# Patient Record
Sex: Female | Born: 1955 | Race: Black or African American | Hispanic: No | Marital: Married | Smoking: Former smoker
Health system: Southern US, Community
[De-identification: ages and names within clinical notes are randomized; demographics above are authoritative.]

## PROBLEM LIST (undated history)

## (undated) DIAGNOSIS — E785 Hyperlipidemia, unspecified: Secondary | ICD-10-CM

## (undated) DIAGNOSIS — G4730 Sleep apnea, unspecified: Secondary | ICD-10-CM

## (undated) DIAGNOSIS — F32A Depression, unspecified: Secondary | ICD-10-CM

## (undated) DIAGNOSIS — K589 Irritable bowel syndrome without diarrhea: Secondary | ICD-10-CM

## (undated) DIAGNOSIS — K219 Gastro-esophageal reflux disease without esophagitis: Secondary | ICD-10-CM

## (undated) DIAGNOSIS — O009 Unspecified ectopic pregnancy without intrauterine pregnancy: Secondary | ICD-10-CM

## (undated) DIAGNOSIS — E119 Type 2 diabetes mellitus without complications: Secondary | ICD-10-CM

## (undated) DIAGNOSIS — F4310 Post-traumatic stress disorder, unspecified: Secondary | ICD-10-CM

## (undated) DIAGNOSIS — I10 Essential (primary) hypertension: Secondary | ICD-10-CM

## (undated) DIAGNOSIS — R42 Dizziness and giddiness: Secondary | ICD-10-CM

## (undated) DIAGNOSIS — M797 Fibromyalgia: Secondary | ICD-10-CM

## (undated) HISTORY — PX: ECTOPIC PREGNANCY SURGERY: SHX613

## (undated) HISTORY — PX: HERNIA REPAIR: SHX51

## (undated) HISTORY — PX: HYSTEROSCOPY: SHX211

## (undated) HISTORY — PX: COLONOSCOPY: SHX174

## (undated) HISTORY — PX: BREAST BIOPSY: SHX20

---

## 2013-05-19 ENCOUNTER — Emergency Department: Payer: Enrolled Prime—HMO

## 2013-05-19 ENCOUNTER — Emergency Department
Admission: EM | Admit: 2013-05-19 | Discharge: 2013-05-19 | Disposition: A | Discharge status: Home / Self Care | Payer: Enrolled Prime—HMO | Attending: Emergency Medicine | Admitting: Emergency Medicine

## 2013-05-19 DIAGNOSIS — J101 Influenza due to other identified influenza virus with other respiratory manifestations: Secondary | ICD-10-CM

## 2013-05-19 DIAGNOSIS — J111 Influenza due to unidentified influenza virus with other respiratory manifestations: Secondary | ICD-10-CM | POA: Insufficient documentation

## 2013-05-19 DIAGNOSIS — Z87891 Personal history of nicotine dependence: Secondary | ICD-10-CM | POA: Insufficient documentation

## 2013-05-19 MED ORDER — IPRATROPIUM BROMIDE 0.02 % IN SOLN
0.5000 mg | Freq: Once | RESPIRATORY_TRACT | Status: AC
Start: 2013-05-19 — End: 2013-05-19
  Administered 2013-05-19: 0.5 mg via RESPIRATORY_TRACT
  Filled 2013-05-19: qty 2.5

## 2013-05-19 MED ORDER — GUAIFENESIN-CODEINE 100-10 MG/5ML PO SYRP
5.0000 mL | ORAL_SOLUTION | Freq: Three times a day (TID) | ORAL | Status: AC | PRN
Start: 2013-05-19 — End: ?

## 2013-05-19 MED ORDER — ALBUTEROL SULFATE (2.5 MG/3ML) 0.083% IN NEBU
2.5000 mg | INHALATION_SOLUTION | Freq: Once | RESPIRATORY_TRACT | Status: AC
Start: 2013-05-19 — End: 2013-05-19
  Administered 2013-05-19: 2.5 mg via RESPIRATORY_TRACT
  Filled 2013-05-19: qty 3

## 2013-05-19 MED ORDER — OSELTAMIVIR PHOSPHATE 75 MG PO CAPS
75.0000 mg | ORAL_CAPSULE | Freq: Two times a day (BID) | ORAL | Status: AC
Start: 2013-05-19 — End: 2013-05-24

## 2013-05-19 MED ORDER — OSELTAMIVIR PHOSPHATE 75 MG PO CAPS
75.0000 mg | ORAL_CAPSULE | Freq: Once | ORAL | Status: AC
Start: 2013-05-19 — End: 2013-05-19
  Administered 2013-05-19: 75 mg via ORAL
  Filled 2013-05-19: qty 1

## 2013-05-19 NOTE — ED Provider Notes (Signed)
ATTENDING :Melvern Sample, DO.  Review of MLP chart- I have reviewed and agree with the clinical impression, history, physical exam and plan.          Melvern Sample, DO  05/19/13 2233

## 2013-05-19 NOTE — Discharge Instructions (Signed)
Influenza, Seasonal     You have been seen for influenza. This is sometimes called “the flu.” Often, no tests are done but influenza is strongly suspected. For this reason, many doctors are giving the diagnosis of “Influenza-Like Illness.”     Influenza is very contagious. This means it spreads easily among people. It is caused by the influenza virus. That is why it is also called "the flu." It affects the respiratory tract. This includes the nose, throat and lungs. In some people, it can cause very serious problems. This usually happens with the very young and the elderly. The Centers for Disease Control and Prevention (CDC) reports that an average of 5-20% of people living in the [place] are affected by regular seasonal influenza every year. This includes influenza A and B. More than 200,000 people are hospitalized from flu complications. About 36,000 people die from flu-related causes every year.     STAY AWAY FROM OTHER PEOPLE WHO ARE NOT YET AFFECTED. THIS IS THE BEST WAY TO STOP THE SPREAD OF INFLUENZA. YOU CAN SPREAD THE FLU FROM ONE DAY BEFORE YOU GET SICK UP TO 7 DAYS AFTER YOU DEVELOP SYMPTOMS.     UNTIL IT IS CONFIRMED THAT YOU DON’T HAVE INFLUENZA, YOU SHOULD:  · Stay off from work. You should be off of work for 7 days from the start of symptoms or until the viral cultures show you don’t have influenza.  · Stay home and do not go into public places.  · Stay away from others in the house.  · Wash your hands often.  · Cover your mouth when you sneeze or cough. Use your sleeve to cover your mouth and nose. Do not use your hand.     People you live with and those you had close contact with, should watch for flu signs. People with a serious medical condition or who are pregnant should tell their doctor they were exposed to flu.     Influenza often happens as an epidemic in the fall and winter. This means many people get the flu virus at the same time. It spreads from person to person. Over the centuries, there  have been epidemics of many different “new” strains of influenza.     Most people with influenza get these symptoms:  · High fevers: Fevers are often higher than 102°F or 39°C.  · Cough. A dry, hacking cough is most common.  · Headache: The headache may be worse with the fever. It can be there even when the temperature is normal.  · Muscle aches. Many people have muscle pain. These are called “myalgias.”  · Sore throat.     You may also feel weak and fatigued (tired). You may also get a runny nose or congestion (stuffy nose). Some people will have mild stomach symptoms. This can include vomiting (throwing up). Sometimes it includes mild diarrhea.     Influenza does not primarily involve vomiting and diarrhea. Vomiting and diarrhea are usually symptoms of a completely different problem. This problem is called "gastroenteritis." Gastroenteritis is often called "the stomach flu." This is how the confusion came about! True influenza is a lung infection.     During flu season, influenza can sometimes be diagnosed without tests. This can be done if a patient has the right set of symptoms. These are called "classic flu" symptoms. Sometimes flu testing is done. This is usually with a test called a “Rapid Influenza Test.” Results show up in a few minutes. The test is not perfect. It misses many cases. Sometimes, more specialized tests   are done. These tests pick up more of the cases. However, results are not ready for a few days.     Usually, the "flu shot" is good at preventing influenza. Some years the shot is less effective than others. You can sometimes still get severe influenza, even if you had the immunization (shot). You can still get "the stomach flu," (not true influenza), even after the flu shot. Today, the current Influenza vaccine is designed to protect against seasonal Influenza A and B and H1N1. There are good supplies of the vaccine available. It is being given to all persons who would like to receive  it.     Influenza treatment depends on how early in the course of the illness you are when diagnosed. With medicine, you may feel better sooner. Medicines do not cure the flu. However, they do help your body fight the infection.     Some antiviral medicines may help symptoms. They often only help if they are started in the first few days of the illness. The Centers for Disease Control and Prevention (CDC) makes recommendations as to who should get the antiviral medicine. Your doctor will decide if the medicine may be helpful for you.     Most patients get better with simple things like rest, fluids and a little time. Acetaminophen (Tylenol®) and/or ibuprofen (Advil® or Motrin®) can also help. Older patients or those with serious medical problems may have worse problems with the flu. They may need additional care.     Treatment of influenza usually is at home. Some patients need to be in the hospital. These are often very young and very old patients and those with severe symptoms. It also includes those with other serious medical problems.     It is OK for you to go home now. Follow the instructions above about staying away from others!     YOU SHOULD SEEK MEDICAL ATTENTION IMMEDIATELY, EITHER HERE OR AT THE NEAREST EMERGENCY DEPARTMENT, IF ANY OF THE FOLLOWING OCCURS:  · You have shortness of breath, wheezing or any severe problems breathing.   · You have a headache that gets worse or a stiff neck.  · You feel lightheaded or faint.  · You have chest pain.  · You have a cough and fever (temperature higher than 100.4ºF or 38ºC) that seem to come back AFTER your flu symptoms get better. This can be a different kind of lung infection called “pneumonia,” that sometimes happens after the flu.  · You feel sicker at any time or do not get better as expected. You can expect to feel sick for a week or so.  · You have any other new symptoms or concerns.     If you can t follow up with your doctor, or if at any time you feel  you need to be rechecked or seen again, come back here or go to the nearest emergency department.

## 2013-05-19 NOTE — ED Notes (Signed)
Cough x 3 days with low grade fever at night; productive yellow sputum

## 2013-05-19 NOTE — ED Provider Notes (Signed)
Physician/Midlevel provider first contact with patient: 05/19/13 1826         History     Chief Complaint   Patient presents with   . Cough     Patient is a 58 y.o. female presenting with cough. The history is provided by the patient.   Cough  This is a new problem. Episode onset: 3 days. The problem occurs constantly. The problem has not changed since onset.The cough is productive of sputum. There has been no fever. Associated symptoms include chills and rhinorrhea. Pertinent negatives include no chest pain, no sweats, no weight loss, no headaches, no sore throat, no myalgias, no shortness of breath and no wheezing. She is not a smoker.       History reviewed. No pertinent past medical history.    Past Surgical History   Procedure Date   . Hernia repair    . Ectopic pregnancy surgery        No family history on file.    Social  History   Substance Use Topics   . Smoking status: Former Games developer   . Smokeless tobacco: Not on file   . Alcohol Use: No       .     No Known Allergies    Current/Home Medications    NITROFURANTOIN (MACRODANTIN) 100 MG CAPSULE    Take 100 mg by mouth.        Review of Systems   Constitutional: Positive for chills. Negative for fever and weight loss.   HENT: Positive for congestion and rhinorrhea. Negative for dental problem, sore throat and trouble swallowing.    Respiratory: Positive for cough. Negative for chest tightness, shortness of breath and wheezing.    Cardiovascular: Negative for chest pain, palpitations and leg swelling.   Gastrointestinal: Negative for nausea, vomiting and abdominal pain.   Genitourinary: Negative for dysuria, frequency and flank pain.   Musculoskeletal: Negative for back pain, myalgias and neck pain.   Skin: Negative for rash.   Neurological: Negative for dizziness, weakness and headaches.   Psychiatric/Behavioral: Negative for confusion, self-injury and decreased concentration.       Physical Exam    BP: 151/85 mmHg, Heart Rate: 95 , Temp: 97.8 F (36.6 C),  Resp Rate: 16 , SpO2: 97 %, Weight: 81.647 kg    Physical Exam   Nursing note and vitals reviewed.  Constitutional: She is oriented to person, place, and time. She appears well-developed and well-nourished.   HENT:   Head: Normocephalic and atraumatic.   Right Ear: External ear normal.   Left Ear: External ear normal.   Mouth/Throat: Oropharynx is clear and moist.   Eyes: Conjunctivae normal and EOM are normal. Pupils are equal, round, and reactive to light. No scleral icterus.   Neck: Normal range of motion. Neck supple.        No meningeal signs   Cardiovascular: Normal rate, regular rhythm, normal heart sounds and intact distal pulses.    Pulses:       Dorsalis pedis pulses are 2+ on the right side, and 2+ on the left side.   Pulmonary/Chest: Effort normal and breath sounds normal. No respiratory distress. She has no wheezes. She exhibits no tenderness.   Abdominal: Soft. Bowel sounds are normal. She exhibits no distension. There is no tenderness. There is no rebound and no guarding.        No pulsatile mass   Musculoskeletal: Normal range of motion. She exhibits no edema and no tenderness.   Neurological: She  is alert and oriented to person, place, and time.   Skin: Skin is warm and dry. No rash noted. No pallor.   Psychiatric: She has a normal mood and affect. Her behavior is normal. Thought content normal.       MDM and ED Course     ED Medication Orders      Start     Status Ordering Provider    05/19/13 1917   oseltamivir (TAMIFLU) capsule 75 mg   Once      Route: Oral  Ordered Dose: 75 mg         Last MAR action:  Given Aleks Nawrot    05/19/13 1851   albuterol (PROVENTIL) nebulizer solution 2.5 mg   RT - Once      Route: Nebulization  Ordered Dose: 2.5 mg         Last MAR action:  Given Rossie Bretado    05/19/13 1851   ipratropium (ATROVENT) 0.02 % nebulizer solution 0.5 mg   RT - Once      Route: Nebulization  Ordered Dose: 0.5 mg         Last MAR action:  Given Tildon Silveria               Radiology Results  (24 Hour)     Procedure Component Value Units Date/Time    Chest 2 Views [[id number]] Collected:05/19/13 1908    Order Status:Completed  Updated:05/19/13 1913    Narrative:    History:  Cough and fever, evaluate for pneumonia.    COMPARISON: None     PA and lateral chest:  The heart size and contour are normal.  Lungs are  clear with normal pulmonary vascularity.  No pleural effusion, hilar or  mediastinal prominence is evident. No pneumothorax is seen. Bony  structures are intact.      Impression:     Normal study.        Geanie Cooley, MD   05/19/2013 7:09 PM        Results     Procedure Component Value Units Date/Time    Influenza A/B Virus Antigen [[id number]] Collected:05/19/13 1843    Specimen Information:Nasopharyngeal / Nasal Aspirate Updated:05/19/13 1900    Narrative:    ORDER#: [id number]                                    ORDERED BY: Carolina Sink  SOURCE: Nasal Aspirate                               COLLECTED:  05/19/13 18:43  ANTIBIOTICS AT COLL.:                                RECEIVED :  05/19/13 18:47  Influenza Rapid Antigen A&B                FINAL       05/19/13 19:01   +  05/19/13   Positive for Influenza A and Negative for Influenza B             Reference Range: Negative            MDM  Number of Diagnoses or Management Options  Influenza A:   Diagnosis management comments: Pt here for cough/congestion for 3 days. Other ppl  at work with simiilar. No fevers. No cp, sob. On exam, pt well appearing. Noted to have coughing. nml lung sounds and nl vitals. No hypoxia and no c/o sob. Pt well appearing. Influenza positive. No resp distress and no hx of lung problems. Stable for outpt management.  Case also discussed with dr Girard Cooter.     I, Carolina Sink- Physician Assistant, have been the primary provider for this pt during their ED stay.     The attending signature signifies review and agreement of the history, physical examination, evaluation, clinical impression and plan except as noted.     02 sat   97%ra, which is nml.           Procedures    Clinical Impression & Disposition     Clinical Impression  Final diagnoses:   Influenza A        ED Disposition     Discharge Gavin Pound discharge to home/self care.    Condition at disposition: Stable             New Prescriptions    GUAIFENESIN-CODEINE (ROBITUSSIN AC) 100-10 MG/5ML SYRUP    Take 5 mLs by mouth 3 (three) times daily as needed for Cough.    OSELTAMIVIR (TAMIFLU) 75 MG CAPSULE    Take 1 capsule (75 mg total) by mouth 2 (two) times daily.                 Mariel Aloe [place], [place]  05/19/13 1958

## 2017-06-20 ENCOUNTER — Emergency Department
Admission: EM | Admit: 2017-06-20 | Discharge: 2017-06-20 | Disposition: A | Discharge status: Home / Self Care | Attending: Emergency Medicine | Admitting: Emergency Medicine

## 2017-06-20 ENCOUNTER — Other Ambulatory Visit: Payer: Self-pay

## 2017-06-20 DIAGNOSIS — I10 Essential (primary) hypertension: Secondary | ICD-10-CM | POA: Insufficient documentation

## 2017-06-20 DIAGNOSIS — R55 Syncope and collapse: Secondary | ICD-10-CM | POA: Diagnosis present

## 2017-06-20 DIAGNOSIS — R42 Dizziness and giddiness: Secondary | ICD-10-CM | POA: Insufficient documentation

## 2017-06-20 HISTORY — DX: Essential (primary) hypertension: I10

## 2017-06-20 HISTORY — DX: Unspecified ectopic pregnancy without intrauterine pregnancy: O00.90

## 2017-06-20 LAB — BASIC METABOLIC PANEL
ANION GAP: 11 (ref 5–15)
BUN: 11 mg/dL (ref 6–20)
CO2: 23 mmol/L (ref 22–32)
Calcium: 9 mg/dL (ref 8.9–10.3)
Chloride: 102 mmol/L (ref 101–111)
Creatinine, Ser: 0.84 mg/dL (ref 0.44–1.00)
GLUCOSE: 129 mg/dL — AB (ref 65–99)
POTASSIUM: 4 mmol/L (ref 3.5–5.1)
SODIUM: 136 mmol/L (ref 135–145)

## 2017-06-20 LAB — URINALYSIS, COMPLETE (UACMP) WITH MICROSCOPIC
BACTERIA UA: NONE SEEN
BILIRUBIN URINE: NEGATIVE
GLUCOSE, UA: NEGATIVE mg/dL
HGB URINE DIPSTICK: NEGATIVE
Ketones, ur: NEGATIVE mg/dL
LEUKOCYTES UA: NEGATIVE
Nitrite: NEGATIVE
PROTEIN: NEGATIVE mg/dL
Specific Gravity, Urine: 1.012 (ref 1.005–1.030)
pH: 7 (ref 5.0–8.0)

## 2017-06-20 LAB — CBC
HEMATOCRIT: 42.9 % (ref 35.0–47.0)
HEMOGLOBIN: 14.2 g/dL (ref 12.0–16.0)
MCH: 28.8 pg (ref 26.0–34.0)
MCHC: 33.2 g/dL (ref 32.0–36.0)
MCV: 86.8 fL (ref 80.0–100.0)
Platelets: 297 10*3/uL (ref 150–440)
RBC: 4.94 MIL/uL (ref 3.80–5.20)
RDW: 13.3 % (ref 11.5–14.5)
WBC: 9.4 10*3/uL (ref 3.6–11.0)

## 2017-06-20 LAB — TROPONIN I

## 2017-06-20 MED ORDER — MECLIZINE HCL 25 MG PO TABS: 25.0000 mg | ORAL_TABLET | Freq: Three times a day (TID) | ORAL | 0 refills | Status: DC | PRN

## 2017-06-20 MED ORDER — ONDANSETRON 4 MG PO TBDP
4.0000 mg | ORAL_TABLET | Freq: Once | ORAL | Status: AC
  Administered 2017-06-20: 4 mg via ORAL

## 2017-06-20 MED ORDER — MECLIZINE HCL 25 MG PO TABS
25.0000 mg | ORAL_TABLET | Freq: Once | ORAL | Status: AC
  Administered 2017-06-20: 25 mg via ORAL
  Filled 2017-06-20: qty 1

## 2017-06-20 MED ORDER — ONDANSETRON 4 MG PO TBDP
ORAL_TABLET | ORAL | Status: AC
  Filled 2017-06-20: qty 1

## 2017-06-20 MED ORDER — SODIUM CHLORIDE 0.9 % IV BOLUS (SEPSIS)
1000.0000 mL | Freq: Once | INTRAVENOUS | Status: AC
  Administered 2017-06-20: 1000 mL via INTRAVENOUS

## 2017-06-20 NOTE — ED Notes (Signed)
Pt currently reporting increased nausea. MD made aware.

## 2017-06-20 NOTE — Discharge Instructions (Signed)
Please seek medical attention for any high fevers, chest pain, shortness of breath, change in behavior, persistent vomiting, bloody stool or any other new or concerning symptoms.  

## 2017-06-20 NOTE — ED Triage Notes (Signed)
Pt states this AM started to feel lightheaded. States went to sleep and felt better but lightheadness has returned. States nausea and fatigue. Denies pain. Pt is alert, oriented, talking in complete sentences. States felt like this Friday but felt fine over weekend. States has been eating/drinking like normal.

## 2017-06-20 NOTE — ED Notes (Signed)
Informed RN that patient has been roomed and is ready for evaluation.  Patient in NAD at this time and call bell placed within reach.   

## 2017-06-20 NOTE — ED Provider Notes (Signed)
[facility]   ____________________________________________   I have reviewed the triage vital signs and the nursing notes.   HISTORY  Chief Complaint Near Syncope   History limited by: Not Limited   HPI Melissa JohnsGlenda D Montez Parks is a 62 y.o. female who presents to the emergency department today because of concern for dizziness and feeling like she was going to pass out. The patient dates that her symptoms started 5 days ago.  It has been intermittent since then.  She does feel lightheaded and dizzy.  She feels like things are moving.  It does get worse when she moves her head.  The patient denies any chest pain or palpitations.  She has had some slight headache.  Has had some nausea but no vomiting.  Denies similar symptoms in the past.    Per medical record review patient has a history of HTN.  Past Medical History:  Diagnosis Date  . Ectopic pregnancy   . Hypertension     There are no active problems to display for this patient.   Past Surgical History:  Procedure Laterality Date  . CESAREAN SECTION    . HERNIA REPAIR      Prior to Admission medications   Not on File    Allergies Premarin [conjugated estrogens]  History reviewed. No pertinent family history.  Social History Social History   Tobacco Use  . Smoking status: Never Smoker  Substance Use Topics  . Alcohol use: Yes  . Drug use: Not on file    Review of Systems Constitutional: No fever/chills Eyes: No visual changes. ENT: No sore throat. Cardiovascular: Denies chest pain. Respiratory: Denies shortness of breath. Gastrointestinal: No abdominal pain.  No nausea, no vomiting.  No diarrhea.   Genitourinary: Negative for dysuria. Musculoskeletal: Negative for back pain. Skin: Negative for rash. Neurological: Positive for headache. Positive for dizziness.  ____________________________________________   PHYSICAL EXAM:  VITAL SIGNS: ED  Triage Vitals  Enc Vitals Group     BP 06/20/17 1826 (!) 171/76     Pulse Rate 06/20/17 1823 65     Resp 06/20/17 1823 18     Temp 06/20/17 1823 97.6 F (36.4 C)     Temp Source 06/20/17 1823 Oral     SpO2 06/20/17 1823 99 %     Weight 06/20/17 1825 190 lb (86.2 kg)     Height 06/20/17 1825 5\' 4"  (1.626 m)     Head Circumference --      Peak Flow --      Pain Score 06/20/17 1823 0   Constitutional: Alert and oriented. Well appearing and in no distress. Eyes: Conjunctivae are normal.  ENT   Head: Normocephalic and atraumatic.   Nose: No congestion/rhinnorhea.   Mouth/Throat: Mucous membranes are moist.   Neck: No stridor. Hematological/Lymphatic/Immunilogical: No cervical lymphadenopathy. Cardiovascular: Normal rate, regular rhythm.  No murmurs, rubs, or gallops.  Respiratory: Normal respiratory effort without tachypnea nor retractions. Breath sounds are clear and equal bilaterally. No wheezes/rales/rhonchi. Gastrointestinal: Soft and non tender. No rebound. No guarding.  Genitourinary: Deferred Musculoskeletal: Normal range of motion in all extremities. No lower extremity edema. Neurologic:  Normal speech and language. PERRL.  EOMI. Tongue midline. Face symmetric. No upper or lower extremity drift. Sensation intact. Finger to nose normal. No gross focal neurologic deficits are appreciated.  Skin:  Skin is warm, dry and intact. No rash noted. Psychiatric: Mood and affect are normal. Speech and behavior are normal. Patient exhibits appropriate insight and  judgment.  ____________________________________________    LABS (pertinent positives/negatives)  Trop <0.03 CBC wnl BMP na 136, glu 129, k 4.0 UA wnl  ____________________________________________   EKG  I, Phineas Semen, attending physician, personally viewed and interpreted this EKG  EKG Time: 1822 Rate: 65 Rhythm: normal sinus rhythm Axis: left axis deviation Intervals: qtc 426 QRS: narrow, lvh ST  changes: no st elevation Impression: abnormal ekg   ____________________________________________    RADIOLOGY  None  ____________________________________________   PROCEDURES  Procedures  ____________________________________________   INITIAL IMPRESSION / ASSESSMENT AND PLAN / ED COURSE  Pertinent labs & imaging results that were available during my care of the patient were reviewed by me and considered in my medical decision making (see chart for details).  Patient presents because of lightheadedness and dizziness.  The patient symptoms have been somewhat on and off over the past couple of days.  Patient's physical exam is benign.  She did feel better with meclizine.  At this time, I have low suspicion for cerebral stroke given intermittent nature and improvement with medications and benign physical exam.  Will plan on discharging with meclizine.  Will have patient follow-up with ENT back home. Discussed plan with patient.   ____________________________________________   FINAL CLINICAL IMPRESSION(S) / ED DIAGNOSES  Final diagnoses:  Vertigo     Note: This dictation was prepared with Dragon dictation. Any transcriptional errors that result from this process are unintentional     Phineas Semen, MD 06/20/17 2325

## 2018-04-04 ENCOUNTER — Emergency Department

## 2018-04-04 ENCOUNTER — Other Ambulatory Visit: Payer: Self-pay

## 2018-04-04 ENCOUNTER — Emergency Department
Admission: EM | Admit: 2018-04-04 | Discharge: 2018-04-04 | Disposition: A | Discharge status: Home / Self Care | Attending: Emergency Medicine | Admitting: Emergency Medicine

## 2018-04-04 DIAGNOSIS — R0789 Other chest pain: Secondary | ICD-10-CM | POA: Diagnosis not present

## 2018-04-04 DIAGNOSIS — R739 Hyperglycemia, unspecified: Secondary | ICD-10-CM | POA: Diagnosis present

## 2018-04-04 DIAGNOSIS — E119 Type 2 diabetes mellitus without complications: Secondary | ICD-10-CM | POA: Insufficient documentation

## 2018-04-04 DIAGNOSIS — I10 Essential (primary) hypertension: Secondary | ICD-10-CM | POA: Diagnosis not present

## 2018-04-04 LAB — BASIC METABOLIC PANEL
Anion gap: 9 (ref 5–15)
BUN: 17 mg/dL (ref 6–20)
CALCIUM: 9.1 mg/dL (ref 8.9–10.3)
CHLORIDE: 98 mmol/L (ref 98–111)
CO2: 24 mmol/L (ref 22–32)
CREATININE: 1.02 mg/dL — AB (ref 0.44–1.00)
GFR calc non Af Amer: 60 mL/min — ABNORMAL LOW (ref >60)
GLUCOSE: 566 mg/dL — AB (ref 70–99)
Potassium: 3.8 mmol/L (ref 3.5–5.1)
SODIUM: 131 mmol/L — AB (ref 135–145)

## 2018-04-04 LAB — URINALYSIS, COMPLETE (UACMP) WITH MICROSCOPIC
Bacteria, UA: NONE SEEN
Bilirubin Urine: NEGATIVE
KETONES UR: 5 mg/dL — AB
Leukocytes, UA: NEGATIVE
Nitrite: NEGATIVE
Protein, ur: NEGATIVE mg/dL
Specific Gravity, Urine: 1.033 — ABNORMAL HIGH (ref 1.005–1.030)
pH: 5 (ref 5.0–8.0)

## 2018-04-04 LAB — CBC
HEMATOCRIT: 45 % (ref 36.0–46.0)
Hemoglobin: 15.3 g/dL — ABNORMAL HIGH (ref 12.0–15.0)
MCH: 28.8 pg (ref 26.0–34.0)
MCHC: 34 g/dL (ref 30.0–36.0)
MCV: 84.6 fL (ref 80.0–100.0)
PLATELETS: 256 10*3/uL (ref 150–400)
RBC: 5.32 MIL/uL — AB (ref 3.87–5.11)
RDW: 12.4 % (ref 11.5–15.5)
WBC: 7.4 10*3/uL (ref 4.0–10.5)
nRBC: 0 % (ref 0.0–0.2)

## 2018-04-04 LAB — GLUCOSE, CAPILLARY
GLUCOSE-CAPILLARY: 310 mg/dL — AB (ref 70–99)
Glucose-Capillary: >600 mg/dL (ref 70–99)
Glucose-Capillary: 572 mg/dL (ref 70–99)
Glucose-Capillary: 434 mg/dL — ABNORMAL HIGH (ref 70–99)

## 2018-04-04 LAB — TROPONIN I: Troponin I: <0.03 ng/mL (ref <0.03)

## 2018-04-04 LAB — T4, FREE: Free T4: 1.31 ng/dL (ref 0.82–1.77)

## 2018-04-04 LAB — TSH: TSH: 0.868 u[IU]/mL (ref 0.350–4.500)

## 2018-04-04 MED ORDER — SODIUM CHLORIDE 0.9 % IV SOLN
Freq: Once | INTRAVENOUS | Status: AC
  Administered 2018-04-04: 15:00:00 via INTRAVENOUS

## 2018-04-04 MED ORDER — SODIUM CHLORIDE 0.9 % IV SOLN
Freq: Once | INTRAVENOUS | Status: AC
  Administered 2018-04-04: 16:00:00 via INTRAVENOUS

## 2018-04-04 MED ORDER — METFORMIN HCL 500 MG PO TABS: 500.0000 mg | ORAL_TABLET | Freq: Two times a day (BID) | ORAL | 11 refills | Status: AC

## 2018-04-04 MED ORDER — INSULIN ASPART 100 UNIT/ML ~~LOC~~ SOLN
10.0000 [IU] | Freq: Once | SUBCUTANEOUS | Status: AC
  Administered 2018-04-04: 10 [IU] via SUBCUTANEOUS
  Filled 2018-04-04: qty 1

## 2018-04-04 MED ORDER — SODIUM CHLORIDE 0.9 % IV BOLUS
1000.0000 mL | Freq: Once | INTRAVENOUS | Status: AC
  Administered 2018-04-04: 1000 mL via INTRAVENOUS

## 2018-04-04 MED ORDER — METFORMIN HCL 500 MG PO TABS
500.0000 mg | ORAL_TABLET | Freq: Once | ORAL | Status: AC
  Administered 2018-04-04: 500 mg via ORAL
  Filled 2018-04-04: qty 1

## 2018-04-04 MED ORDER — METFORMIN HCL 500 MG PO TABS: 500.0000 mg | ORAL_TABLET | Freq: Two times a day (BID) | ORAL | 11 refills | Status: DC

## 2018-04-04 NOTE — ED Provider Notes (Signed)
[facility] Emergency Department Provider Note       Time seen: ----------------------------------------- 2:29 PM on 04/04/2018 -----------------------------------------   I have reviewed the triage vital signs and the nursing notes.  HISTORY   Chief Complaint Hyperglycemia    HPI Melissa Parks is a 62 y.o. female with a history of hypertension, ectopic pregnancy who presents to the ED for hyperglycemia.  Patient states no history of diabetes, states borderline hyperglycemia earlier this year.  Patient states it feels like her heart is going to burst.  She complains of increased thirst and increased urination for the past 3 weeks with weight loss.  Past Medical History:  Diagnosis Date  . Ectopic pregnancy   . Hypertension     There are no active problems to display for this patient.   Past Surgical History:  Procedure Laterality Date  . CESAREAN SECTION    . HERNIA REPAIR      Allergies Premarin [conjugated estrogens]  Social History Social History   Tobacco Use  . Smoking status: Never Smoker  Substance Use Topics  . Alcohol use: Yes  . Drug use: Not on file   Review of Systems Constitutional: Positive for weight loss, increased thirst ENT:  Negative for congestion, sore throat Cardiovascular: Negative for chest pain. Respiratory: Negative for shortness of breath. Gastrointestinal: Negative for abdominal pain, vomiting and diarrhea. Genitourinary: Positive for polyuria Musculoskeletal: Negative for back pain. Skin: Negative for rash. Neurological: Negative for headaches, focal weakness or numbness.  All systems negative/normal/unremarkable except as stated in the HPI  ____________________________________________   PHYSICAL EXAM:  VITAL SIGNS: ED Triage Vitals  Enc Vitals Group     BP 04/04/18 1314 (!) 164/97     Pulse Rate 04/04/18 1314 (!) 111     Resp 04/04/18 1314 18     Temp 04/04/18 1314 97.9 F (36.6 C)   Temp Source 04/04/18 1314 Oral     SpO2 04/04/18 1314 98 %     Weight 04/04/18 1315 174 lb (78.9 kg)     Height 04/04/18 1315 5\' 4"  (1.626 m)     Head Circumference --      Peak Flow --      Pain Score 04/04/18 1315 0     Pain Loc --      Pain Edu? --      Excl. in GC? --    Constitutional: Alert and oriented. Well appearing and in no distress. Eyes: Conjunctivae are normal. Normal extraocular movements. ENT   Head: Normocephalic and atraumatic.   Nose: No congestion/rhinnorhea.   Mouth/Throat: Mucous membranes are moist.   Neck: No stridor. Cardiovascular: Normal rate, regular rhythm. No murmurs, rubs, or gallops. Respiratory: Normal respiratory effort without tachypnea nor retractions. Breath sounds are clear and equal bilaterally. No wheezes/rales/rhonchi. Gastrointestinal: Soft and nontender. Normal bowel sounds Musculoskeletal: Nontender with normal range of motion in extremities. No lower extremity tenderness nor edema. Neurologic:  Normal speech and language. No gross focal neurologic deficits are appreciated.  Skin:  Skin is warm, dry and intact. No rash noted. Psychiatric: Mood and affect are normal. Speech and behavior are normal.  ____________________________________________  EKG: Interpreted by me.  Sinus rhythm the rate 86 bpm, nonspecific ST segment changes, normal axis, normal QT  ____________________________________________  ED COURSE:  As part of my medical decision making, I reviewed the following data within the electronic MEDICAL RECORD NUMBER History obtained from family if available, nursing notes, old chart and ekg, as well as notes from  prior ED visits. Patient presented for symptoms of diabetes, we will assess with labs and imaging as indicated at this time.   Procedures ____________________________________________   LABS (pertinent positives/negatives)  Labs Reviewed  CBC - Abnormal; Notable for the following components:      Result Value    RBC 5.32 (*)    Hemoglobin 15.3 (*)    All other components within normal limits  URINALYSIS, COMPLETE (UACMP) WITH MICROSCOPIC - Abnormal; Notable for the following components:   Color, Urine YELLOW (*)    APPearance CLEAR (*)    Specific Gravity, Urine 1.033 (*)    Glucose, UA >=500 (*)    Hgb urine dipstick SMALL (*)    Ketones, ur 5 (*)    All other components within normal limits  GLUCOSE, CAPILLARY - Abnormal; Notable for the following components:   Glucose-Capillary >600 (*)    All other components within normal limits  BASIC METABOLIC PANEL - Abnormal; Notable for the following components:   Sodium 131 (*)    Glucose, Bld 566 (*)    Creatinine, Ser 1.02 (*)    GFR calc non Af Amer 60 (*)    All other components within normal limits  GLUCOSE, CAPILLARY - Abnormal; Notable for the following components:   Glucose-Capillary 572 (*)    All other components within normal limits  GLUCOSE, CAPILLARY - Abnormal; Notable for the following components:   Glucose-Capillary 434 (*)    All other components within normal limits  GLUCOSE, CAPILLARY - Abnormal; Notable for the following components:   Glucose-Capillary 310 (*)    All other components within normal limits  TROPONIN I  T4, FREE  TSH  CBG MONITORING, ED    RADIOLOGY Images were viewed by me  Chest x-ray IMPRESSION: No active disease. ____________________________________________  DIFFERENTIAL DIAGNOSIS   Dehydration, electrolyte abnormality, DKA, HH NK, renal failure  FINAL ASSESSMENT AND PLAN  Diabetes mellitus   Plan: The patient had presented for symptoms of hyperglycemia. Patient's labs reveal significant hyperglycemia and dehydration.  Patient was given 2 L of IV fluid as well as subcutaneous insulin.  We started her on metformin.  Patient's imaging was unremarkable.  Currently she is feeling better, she has an appointment with her doctor next week for reevaluation.   Ulice DashJohnathan E Williams, MD   Note:  This note was generated in part or whole with voice recognition software. Voice recognition is usually quite accurate but there are transcription errors that can and very often do occur. I apologize for any typographical errors that were not detected and corrected.     Emily FilbertWilliams, Jonathan E, MD 04/04/18 [phone]

## 2018-04-04 NOTE — ED Notes (Signed)
FIRST NURSE NOTE:  Pt reports multiple medical complaints, worried about possible elevated blood sugar. Pt states she has lost weight over the past 3 weeks, has had increased thirst and increased urination.

## 2018-04-04 NOTE — ED Triage Notes (Signed)
A&O, ambulatory. No distress noted. States CBG 420 yesterday. States no hx of DM, states borderline earlier this year. States "feels like my heart is going to burst". C/o of symptoms of hyperglycemia.

## 2018-04-05 ENCOUNTER — Encounter: Payer: Self-pay | Admitting: Emergency Medicine

## 2018-04-05 ENCOUNTER — Emergency Department
Admission: EM | Admit: 2018-04-05 | Discharge: 2018-04-05 | Disposition: A | Discharge status: Home / Self Care | Attending: Emergency Medicine | Admitting: Emergency Medicine

## 2018-04-05 ENCOUNTER — Other Ambulatory Visit: Payer: Self-pay

## 2018-04-05 DIAGNOSIS — I10 Essential (primary) hypertension: Secondary | ICD-10-CM | POA: Insufficient documentation

## 2018-04-05 DIAGNOSIS — E1165 Type 2 diabetes mellitus with hyperglycemia: Secondary | ICD-10-CM | POA: Diagnosis not present

## 2018-04-05 DIAGNOSIS — R42 Dizziness and giddiness: Secondary | ICD-10-CM | POA: Diagnosis not present

## 2018-04-05 DIAGNOSIS — Z79899 Other long term (current) drug therapy: Secondary | ICD-10-CM | POA: Insufficient documentation

## 2018-04-05 DIAGNOSIS — R739 Hyperglycemia, unspecified: Secondary | ICD-10-CM

## 2018-04-05 DIAGNOSIS — Z7984 Long term (current) use of oral hypoglycemic drugs: Secondary | ICD-10-CM | POA: Diagnosis not present

## 2018-04-05 HISTORY — DX: Type 2 diabetes mellitus without complications: E11.9

## 2018-04-05 LAB — URINALYSIS, COMPLETE (UACMP) WITH MICROSCOPIC
Bilirubin Urine: NEGATIVE
Glucose, UA: >=500 mg/dL — AB
Ketones, ur: 5 mg/dL — AB
Leukocytes, UA: NEGATIVE
Nitrite: NEGATIVE
Protein, ur: NEGATIVE mg/dL
Specific Gravity, Urine: 1.015 (ref 1.005–1.030)
pH: 5 (ref 5.0–8.0)

## 2018-04-05 LAB — BASIC METABOLIC PANEL
Anion gap: 10 (ref 5–15)
BUN: 13 mg/dL (ref 6–20)
CO2: 21 mmol/L — ABNORMAL LOW (ref 22–32)
Calcium: 9.2 mg/dL (ref 8.9–10.3)
Chloride: 101 mmol/L (ref 98–111)
Creatinine, Ser: 0.8 mg/dL (ref 0.44–1.00)
GFR calc Af Amer: >60 mL/min (ref >60)
GFR calc non Af Amer: >60 mL/min (ref >60)
Glucose, Bld: 365 mg/dL — ABNORMAL HIGH (ref 70–99)
POTASSIUM: 3.5 mmol/L (ref 3.5–5.1)
Sodium: 132 mmol/L — ABNORMAL LOW (ref 135–145)

## 2018-04-05 LAB — CBC
HCT: 44.5 % (ref 36.0–46.0)
HEMOGLOBIN: 14.9 g/dL (ref 12.0–15.0)
MCH: 28.7 pg (ref 26.0–34.0)
MCHC: 33.5 g/dL (ref 30.0–36.0)
MCV: 85.6 fL (ref 80.0–100.0)
Platelets: 253 10*3/uL (ref 150–400)
RBC: 5.2 MIL/uL — ABNORMAL HIGH (ref 3.87–5.11)
RDW: 12.5 % (ref 11.5–15.5)
WBC: 8.3 10*3/uL (ref 4.0–10.5)
nRBC: 0 % (ref 0.0–0.2)

## 2018-04-05 LAB — GLUCOSE, CAPILLARY
GLUCOSE-CAPILLARY: 299 mg/dL — AB (ref 70–99)
Glucose-Capillary: 341 mg/dL — ABNORMAL HIGH (ref 70–99)

## 2018-04-05 MED ORDER — MECLIZINE HCL 25 MG PO TABS: 25.0000 mg | ORAL_TABLET | Freq: Three times a day (TID) | ORAL | 0 refills | Status: AC | PRN

## 2018-04-05 MED ORDER — SODIUM CHLORIDE 0.9 % IV BOLUS
500.0000 mL | Freq: Once | INTRAVENOUS | Status: AC
  Administered 2018-04-05: 500 mL via INTRAVENOUS

## 2018-04-05 NOTE — ED Triage Notes (Signed)
Pt was here yesterday and discharged with new onset DM.  Was sent home with metformin and started it this morning. Reports eating oatmeal and drinking water today but it was still over 400 so came back.  No pain. Polydipsia.  Only urinated twice since waking per pt.

## 2018-04-05 NOTE — ED Provider Notes (Signed)
[facility] Emergency Department Provider Note   ____________________________________________    I have reviewed the triage vital signs and the nursing notes.   HISTORY  Chief Complaint Hyperglycemia     HPI Melissa Parks is a 62 y.o. female presents with elevated glucose and an episode of "room spinning this morning ".  Patient seen yesterday diagnosed with diabetes, started on metformin after IV fluids and insulin.  This morning her glucose was in the high 300s and she reports when she was sitting on the toilet she had an episode of a sensation of the room spinning.  No lightheadedness.  She reports this is similar to prior episodes of vertigo that she has had.  Denies neuro deficits.  No headache.  No fevers or chills.  Reports compliance with medications.  Concerned that her glucose is not "normal ".   Past Medical History:  Diagnosis Date  . Diabetes mellitus without complication (HCC)   . Ectopic pregnancy   . Hypertension     There are no active problems to display for this patient.   Past Surgical History:  Procedure Laterality Date  . CESAREAN SECTION    . HERNIA REPAIR      Prior to Admission medications   Medication Sig Start Date End Date Taking? Authorizing Provider  fexofenadine (ALLEGRA) 180 MG tablet Take 180 mg by mouth daily.    [provider]  hydrochlorothiazide (MICROZIDE) 12.5 MG capsule Take 12.5 mg by mouth daily.    [provider]  meclizine (ANTIVERT) 25 MG tablet Take 1 tablet (25 mg total) by mouth 3 (three) times daily as needed for dizziness. 04/05/18   Jene EveryKinner, Yan Okray, MD  metFORMIN (GLUCOPHAGE) 500 MG tablet Take 1 tablet (500 mg total) by mouth 2 (two) times daily with a meal. 04/04/18 04/04/19  Emily FilbertWilliams, Jonathan E, MD  RABEprazole (ACIPHEX) 20 MG tablet Take 20 mg by mouth daily.    [provider]  Specialty Vitamins Products [facility](GLYCOTROL COMPLETE) CAPS Take by mouth daily.     [provider]     Allergies Premarin [conjugated estrogens]  History reviewed. No pertinent family history.  Social History Social History   Tobacco Use  . Smoking status: Never Smoker  Substance Use Topics  . Alcohol use: Yes  . Drug use: Not on file    Review of Systems  Constitutional: No fever/chills Eyes: No visual changes.  ENT: No sore throat. Cardiovascular: Denies chest pain. Respiratory: Denies shortness of breath. Gastrointestinal: No abdominal pain.  No nausea, no vomiting.   Genitourinary: Negative for dysuria. Musculoskeletal: Negative for back pain. Skin: Negative for rash. Neurological: As above s   ____________________________________________   PHYSICAL EXAM:  VITAL SIGNS: ED Triage Vitals  Enc Vitals Group     BP 04/05/18 1530 (!) 176/97     Pulse Rate 04/05/18 1530 93     Resp 04/05/18 1530 18     Temp 04/05/18 1530 98 F (36.7 C)     Temp Source 04/05/18 1530 Oral     SpO2 04/05/18 1530 98 %     Weight --      Height --      Head Circumference --      Peak Flow --      Pain Score 04/05/18 1535 0     Pain Loc --      Pain Edu? --      Excl. in GC? --     Constitutional: Alert and oriented.  No acute distress.  Eyes: Conjunctivae are normal.  Rightward nystagmus noted, PERRLA, EOMI Head: Atraumatic. Nose: No congestion/rhinnorhea. Mouth/Throat: Mucous membranes are moist.    Cardiovascular: Normal rate, regular rhythm. Grossly normal heart sounds.  Good peripheral circulation. Respiratory: Normal respiratory effort.  No retractions. Lungs CTAB. Gastrointestinal: Soft and nontender. No distention.  No CVA tenderness.  Musculoskeletal: No lower extremity tenderness nor edema.  Warm and well perfused Neurologic:  Normal speech and language. No gross focal neurologic deficits are appreciated.  Skin:  Skin is warm, dry and intact. No rash noted. Psychiatric: Mood and affect are normal. Speech and behavior are  normal.  ____________________________________________   LABS (all labs ordered are listed, but only abnormal results are displayed)  Labs Reviewed  BASIC METABOLIC PANEL - Abnormal; Notable for the following components:      Result Value   Sodium 132 (*)    CO2 21 (*)    Glucose, Bld 365 (*)    All other components within normal limits  CBC - Abnormal; Notable for the following components:   RBC 5.20 (*)    All other components within normal limits  URINALYSIS, COMPLETE (UACMP) WITH MICROSCOPIC - Abnormal; Notable for the following components:   Color, Urine YELLOW (*)    APPearance CLEAR (*)    Glucose, UA >=500 (*)    Hgb urine dipstick SMALL (*)    Ketones, ur 5 (*)    Bacteria, UA RARE (*)    All other components within normal limits  GLUCOSE, CAPILLARY - Abnormal; Notable for the following components:   Glucose-Capillary 341 (*)    All other components within normal limits  GLUCOSE, CAPILLARY - Abnormal; Notable for the following components:   Glucose-Capillary 299 (*)    All other components within normal limits  CBG MONITORING, ED   ____________________________________________  EKG  None ____________________________________________  RADIOLOGY  None ____________________________________________   PROCEDURES  Procedure(s) performed: No  Procedures   Critical Care performed: No ____________________________________________   INITIAL IMPRESSION / ASSESSMENT AND PLAN / ED COURSE  Pertinent labs & imaging results that were available during my care of the patient were reviewed by me and considered in my medical decision making (see chart for details).  Patient well-appearing and in no acute distress.  Symptoms of room spinning consistent with vertigo, likely BPV, horizontal nystagmus noted on exam.  She reports much improvement in the symptoms however.  She is primarily concerned about elevated glucose, glucose was 365 on BMP with normal anion gap.   Reassurance provided to the patient, noted that increased blood levels of metformin needed for appropriate glucose lowering, she does have follow-up with PCP.  We will give 500 cc IV fluids here outpatient follow-up    ____________________________________________   FINAL CLINICAL IMPRESSION(S) / ED DIAGNOSES  Final diagnoses:  Hyperglycemia  Vertigo        Note:  This document was prepared using Dragon voice recognition software and may include unintentional dictation errors.    Jene Every, MD 04/05/18 1900

## 2019-06-19 IMAGING — DX DG CHEST 1V
1 series · 1 of 1 positions shown · non-contrast
Comparison: None.

CLINICAL DATA: Chest discomfort and hyperglycemia

EXAM:
CHEST  1 VIEW

[chest ap]
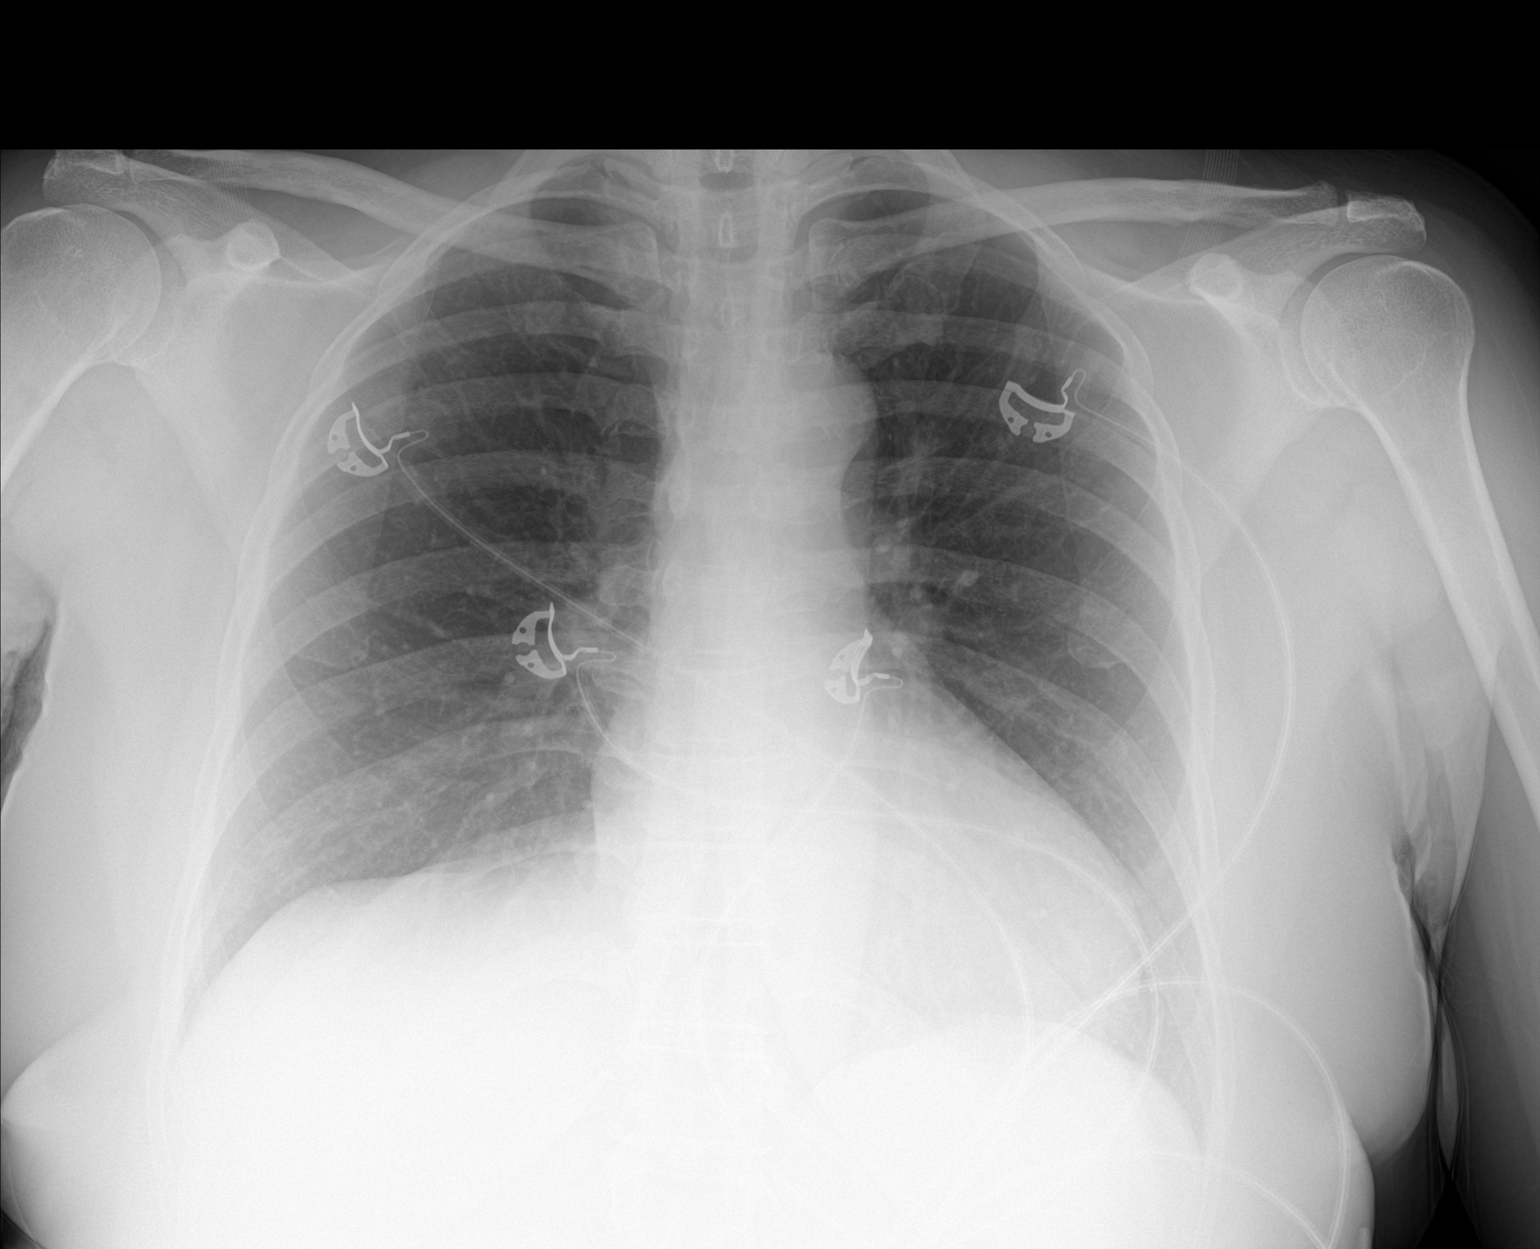

[1 of 1 positions shown; findings below may reference images not displayed]

FINDINGS: The heart size and mediastinal contours are within normal limits.
Both lungs are clear. The visualized skeletal structures are
unremarkable.
IMPRESSION: No active disease.

## 2022-07-11 ENCOUNTER — Encounter: Payer: Self-pay | Admitting: Otolaryngology

## 2022-07-18 ENCOUNTER — Encounter: Payer: Self-pay | Admitting: Otolaryngology

## 2022-07-18 ENCOUNTER — Ambulatory Visit
Admission: RE | Admit: 2022-07-18 | Discharge: 2022-07-18 | Disposition: A | Discharge status: Home / Self Care | Payer: Medicare Other | Source: Ambulatory Visit | Attending: Otolaryngology | Admitting: Otolaryngology

## 2022-07-18 ENCOUNTER — Encounter
Admission: RE | Disposition: A | Discharge status: Home / Self Care | Payer: Self-pay | Source: Ambulatory Visit | Attending: Otolaryngology

## 2022-07-18 ENCOUNTER — Ambulatory Visit: Payer: Medicare Other | Admitting: Anesthesiology

## 2022-07-18 ENCOUNTER — Other Ambulatory Visit: Payer: Self-pay

## 2022-07-18 DIAGNOSIS — Z7985 Long-term (current) use of injectable non-insulin antidiabetic drugs: Secondary | ICD-10-CM | POA: Insufficient documentation

## 2022-07-18 DIAGNOSIS — Z7984 Long term (current) use of oral hypoglycemic drugs: Secondary | ICD-10-CM | POA: Diagnosis not present

## 2022-07-18 DIAGNOSIS — K219 Gastro-esophageal reflux disease without esophagitis: Secondary | ICD-10-CM | POA: Insufficient documentation

## 2022-07-18 DIAGNOSIS — M797 Fibromyalgia: Secondary | ICD-10-CM | POA: Insufficient documentation

## 2022-07-18 DIAGNOSIS — D140 Benign neoplasm of middle ear, nasal cavity and accessory sinuses: Secondary | ICD-10-CM | POA: Insufficient documentation

## 2022-07-18 DIAGNOSIS — E119 Type 2 diabetes mellitus without complications: Secondary | ICD-10-CM | POA: Diagnosis not present

## 2022-07-18 HISTORY — PX: EXCISION NASAL MASS: SHX6271

## 2022-07-18 HISTORY — DX: Post-traumatic stress disorder, unspecified: F43.10

## 2022-07-18 HISTORY — DX: Dizziness and giddiness: R42

## 2022-07-18 HISTORY — DX: Fibromyalgia: M79.7

## 2022-07-18 HISTORY — DX: Gastro-esophageal reflux disease without esophagitis: K21.9

## 2022-07-18 HISTORY — DX: Irritable bowel syndrome, unspecified: K58.9

## 2022-07-18 HISTORY — DX: Depression, unspecified: F32.A

## 2022-07-18 HISTORY — DX: Sleep apnea, unspecified: G47.30

## 2022-07-18 HISTORY — DX: Hyperlipidemia, unspecified: E78.5

## 2022-07-18 LAB — GLUCOSE, CAPILLARY: Glucose-Capillary: 134 mg/dL — ABNORMAL HIGH (ref 70–99)

## 2022-07-18 SURGERY — EXCISION, MASS, NOSE
Anesthesia: General | Laterality: Left

## 2022-07-18 MED ORDER — PROPOFOL 10 MG/ML IV BOLUS
INTRAVENOUS | Status: DC | PRN
  Administered 2022-07-18: 120 mg via INTRAVENOUS
  Administered 2022-07-18: 30 mg via INTRAVENOUS

## 2022-07-18 MED ORDER — ACETAMINOPHEN 160 MG/5ML PO SOLN: 960.0000 mg | Freq: Once | ORAL | Status: AC

## 2022-07-18 MED ORDER — TRAMADOL HCL 50 MG PO TABS: 50.0000 mg | ORAL_TABLET | Freq: Four times a day (QID) | ORAL | 0 refills | Status: AC | PRN

## 2022-07-18 MED ORDER — MIDAZOLAM HCL 5 MG/5ML IJ SOLN
INTRAMUSCULAR | Status: DC | PRN
  Administered 2022-07-18: 2 mg via INTRAVENOUS

## 2022-07-18 MED ORDER — ACETAMINOPHEN 500 MG PO TABS
1000.0000 mg | ORAL_TABLET | Freq: Once | ORAL | Status: AC
  Administered 2022-07-18: 1000 mg via ORAL

## 2022-07-18 MED ORDER — DEXAMETHASONE SODIUM PHOSPHATE 4 MG/ML IJ SOLN
INTRAMUSCULAR | Status: DC | PRN
  Administered 2022-07-18: 4 mg via INTRAVENOUS

## 2022-07-18 MED ORDER — LACTATED RINGERS IV SOLN: INTRAVENOUS | Status: DC

## 2022-07-18 MED ORDER — LIDOCAINE-EPINEPHRINE 1 %-1:100000 IJ SOLN
INTRAMUSCULAR | Status: DC | PRN
  Administered 2022-07-18: 6 mL

## 2022-07-18 MED ORDER — FENTANYL CITRATE (PF) 100 MCG/2ML IJ SOLN
INTRAMUSCULAR | Status: DC | PRN
  Administered 2022-07-18: 50 ug via INTRAVENOUS
  Administered 2022-07-18 (×2): 25 ug via INTRAVENOUS

## 2022-07-18 MED ORDER — STERILE WATER FOR IRRIGATION IR SOLN
Status: DC | PRN
  Administered 2022-07-18: 250 mL

## 2022-07-18 MED ORDER — ONDANSETRON HCL 4 MG/2ML IJ SOLN
INTRAMUSCULAR | Status: DC | PRN
  Administered 2022-07-18: 4 mg via INTRAVENOUS

## 2022-07-18 MED ORDER — OXYMETAZOLINE HCL 0.05 % NA SOLN
NASAL | Status: DC | PRN
  Administered 2022-07-18: 1 via TOPICAL

## 2022-07-18 SURGICAL SUPPLY — 22 items
ANTIFOG SOL W/FOAM PAD STRL (MISCELLANEOUS) ×1
CANISTER SUCT 1200ML W/VALVE (MISCELLANEOUS) ×1 IMPLANT
COAG SUCTION FOOTSWITCH 10FR (SUCTIONS) IMPLANT
COAGULATOR SUCT 8FR VV (MISCELLANEOUS) IMPLANT
DRESSING NASL FOAM PST OP SINU (MISCELLANEOUS) IMPLANT
DRSG NASAL FOAM POST OP SINU (MISCELLANEOUS)
ELECT REM PT RETURN 9FT ADLT (ELECTROSURGICAL) ×1
ELECTRODE REM PT RTRN 9FT ADLT (ELECTROSURGICAL) ×1 IMPLANT
GLOVE SURG ENC MOIS LTX SZ7.5 (GLOVE) ×2 IMPLANT
GOWN STRL REUS W/ TWL LRG LVL3 (GOWN DISPOSABLE) ×1 IMPLANT
GOWN STRL REUS W/TWL LRG LVL3 (GOWN DISPOSABLE) ×1
IV NS 500ML (IV SOLUTION) ×1
IV NS 500ML BAXH (IV SOLUTION) ×1 IMPLANT
KIT TURNOVER KIT A (KITS) ×1 IMPLANT
NS IRRIG 500ML POUR BTL (IV SOLUTION) ×1 IMPLANT
PACK ENT CUSTOM (PACKS) ×1 IMPLANT
PACKING NASAL EPIS 4X2.4 XEROG (MISCELLANEOUS) IMPLANT
PATTIES SURGICAL .5 X3 (DISPOSABLE) ×1 IMPLANT
SOLUTION ANTFG W/FOAM PAD STRL (MISCELLANEOUS) IMPLANT
STRAP BODY AND KNEE 60X3 (MISCELLANEOUS) IMPLANT
SYR 10ML LL (SYRINGE) ×1 IMPLANT
WATER STERILE IRR 250ML POUR (IV SOLUTION) IMPLANT

## 2022-07-18 NOTE — Op Note (Signed)
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 Most consistent grossly with a papilloma.   Procedure: After the patient was identified in holding and the benefits of the procedure were reviewed as well as the consent and risks, the patient was taken to the operating room and with the patient in a comfortable supine position,  general anesthesia was induced without difficulty using an LMA.  A proper time-out was performed.    Next 1% Xylocaine with 1:100,000 epinephrine was infiltrated into the left septum.  Several minutes were allowed for this to take effect.  Cottonoid pledgets soaked in Afrin were placed into both nasal cavities and left while the patient was prepped and draped in the standard fashion.  The left nasal cavity was inspected endoscopically, noting the septal lesion. No other abnormalities were found. The lesion was excised at the base with endoscopic scissors. The base of the lesion was then cauterized with the suction cautery.   The nose was suctioned and inspected and bleeding was well controlled. The right nasal cavity was inspected and no lesions or infection seen.   The patient was then returned to the anesthesiologist for awakening and taken to recovery room in good condition postoperatively.  Disposition:   PACU and d/c home  Plan: Ice, elevation, narcotic analgesia and prophylactic antibiotics. Begin nasal irrigations with saline tomorrow, irrigating 3-4 times daily. Return to the office in 2-3 weeks.  Return to work in 7-10 days, no strenuous activities  for two weeks.   Riley Nearing 07/18/2022 9:30 AM

## 2022-07-18 NOTE — Transfer of Care (Signed)
Immediate Anesthesia Transfer of Care Note  Patient: Melissa Parks  Procedure(s) Performed: ENDOSCOPIC EXCISION NASAL SEPTAL LESION (Left)  Patient Location: PACU  Anesthesia Type: General  Level of Consciousness: awake, alert  and patient cooperative  Airway and Oxygen Therapy: Patient Spontanous Breathing and Patient connected to supplemental oxygen  Post-op Assessment: Post-op Vital signs reviewed, Patient's Cardiovascular Status Stable, Respiratory Function Stable, Patent Airway and No signs of Nausea or vomiting  Post-op Vital Signs: Reviewed and stable  Complications: No notable events documented.

## 2022-07-18 NOTE — H&P (Signed)
History and physical reviewed and will be scanned in later. No change in medical status reported by the patient or family, appears stable for surgery. All questions regarding the procedure answered, and patient (or family if a child) expressed understanding of the procedure. ? ?Melissa Parks S [name] ?@TODAY@ ?

## 2022-07-18 NOTE — Anesthesia Preprocedure Evaluation (Signed)
Anesthesia Evaluation  Patient identified by MRN, date of birth, ID band Patient awake    Reviewed: Allergy & Precautions, NPO status , Patient's Chart, lab work & pertinent test results  History of Anesthesia Complications Negative for: history of anesthetic complications  Airway Mallampati: II  TM Distance: >3 FB Neck ROM: Full    Dental no notable dental hx. (+) Teeth Intact   Pulmonary neg pulmonary ROS, neg sleep apnea, neg COPD, Patient abstained from smoking.Not current smoker, former smoker OSA diagnosis in past, but has since lost a lot of weight and no longer uses cpap   Pulmonary exam normal breath sounds clear to auscultation       Cardiovascular Exercise Tolerance: Good METShypertension, (-) CAD and (-) Past MI (-) dysrhythmias  Rhythm:Regular Rate:Normal - Systolic murmurs    Neuro/Psych  PSYCHIATRIC DISORDERS Anxiety Depression     Neuromuscular disease    GI/Hepatic ,GERD  Medicated and Controlled,,(+)     (-) substance abuse    Endo/Other  diabetes, Oral Hypoglycemic Agents  On Trulicity GLP1, last taken 7 days ago. Denies GI symptoms today  Renal/GU negative Renal ROS     Musculoskeletal  (+)  Fibromyalgia -  Abdominal   Peds  Hematology   Anesthesia Other Findings Past Medical History: No date: Depression No date: Diabetes mellitus without complication (HCC) No date: Ectopic pregnancy No date: Fibromyalgia No date: GERD (gastroesophageal reflux disease) No date: Hyperlipidemia No date: Hypertension No date: IBS (irritable bowel syndrome) No date: PTSD (post-traumatic stress disorder) No date: Sleep apnea     Comment:  has CPAP - not using since wt loss No date: Vertigo  Reproductive/Obstetrics                             Anesthesia Physical Anesthesia Plan  ASA: 2  Anesthesia Plan: General   Post-op Pain Management: Tylenol PO (pre-op) and Fentanyl IV    Induction: Intravenous  PONV Risk Score and Plan: 3 and Ondansetron, Dexamethasone and Midazolam  Airway Management Planned: LMA  Additional Equipment: None  Intra-op Plan:   Post-operative Plan: Extubation in OR  Informed Consent: I have reviewed the patients History and Physical, chart, labs and discussed the procedure including the risks, benefits and alternatives for the proposed anesthesia with the patient or authorized representative who has indicated his/her understanding and acceptance.     Dental advisory given  Plan Discussed with: CRNA and Surgeon  Anesthesia Plan Comments: (Discussed risks of anesthesia with patient, including PONV, sore throat, lip/dental/eye damage. Rare risks discussed as well, such as cardiorespiratory and neurological sequelae, and allergic reactions. Discussed the role of CRNA in patient's perioperative care. Patient understands.)       Anesthesia Quick Evaluation

## 2022-07-18 NOTE — Anesthesia Postprocedure Evaluation (Signed)
Anesthesia Post Note  Patient: Melissa Parks  Procedure(s) Performed: ENDOSCOPIC EXCISION NASAL SEPTAL LESION (Left)  Patient location during evaluation: PACU Anesthesia Type: General Level of consciousness: awake and alert Pain management: pain level controlled Vital Signs Assessment: post-procedure vital signs reviewed and stable Respiratory status: spontaneous breathing, nonlabored ventilation, respiratory function stable and patient connected to nasal cannula oxygen Cardiovascular status: blood pressure returned to baseline and stable Postop Assessment: no apparent nausea or vomiting Anesthetic complications: no   No notable events documented.   Last Vitals:  Vitals:   07/18/22 0937 07/18/22 0945  BP: (!) 143/89 138/84  Pulse: (!) 103 89  Resp: 14 19  Temp: (!) 36.4 C (!) 36.4 C  SpO2: 97% 96%    Last Pain:  Vitals:   07/18/22 0945  TempSrc:   PainSc: 0-No pain                 Arita Miss

## 2022-07-19 ENCOUNTER — Encounter: Payer: Self-pay | Admitting: Otolaryngology

## 2022-07-20 LAB — SURGICAL PATHOLOGY
